# Patient Record
Sex: Male | Born: 1945 | Race: Black or African American | Hispanic: No | Marital: Married
Health system: Midwestern US, Academic
[De-identification: ages and names within clinical notes are randomized; demographics above are authoritative.]

---

## 2021-04-01 ENCOUNTER — Encounter: Admit: 2021-04-01 | Discharge: 2021-04-01 | Payer: MEDICARE

## 2021-04-01 DIAGNOSIS — R0600 Dyspnea, unspecified: Secondary | ICD-10-CM

## 2021-04-01 DIAGNOSIS — G479 Sleep disorder, unspecified: Secondary | ICD-10-CM

## 2021-04-01 DIAGNOSIS — R519 Generalized headaches: Secondary | ICD-10-CM

## 2021-04-01 DIAGNOSIS — I10 Essential (primary) hypertension: Secondary | ICD-10-CM

## 2021-04-01 DIAGNOSIS — F0390 Unspecified dementia without behavioral disturbance: Secondary | ICD-10-CM

## 2021-04-01 DIAGNOSIS — M109 Gout, unspecified: Secondary | ICD-10-CM

## 2021-12-15 ENCOUNTER — Encounter: Admit: 2021-12-15 | Discharge: 2021-12-15 | Payer: MEDICARE

## 2021-12-15 NOTE — Telephone Encounter
12/15/21 - Called and spoke to patient's wife / the patient does not have any outside cardiac records / records are being requested from his PCP - Dr. Dorris Carnes - [facility] NP / (F) [phone] ([phone] / sjg

## 2021-12-16 ENCOUNTER — Encounter: Admit: 2021-12-16 | Discharge: 2021-12-16 | Payer: MEDICARE

## 2021-12-16 DIAGNOSIS — G479 Sleep disorder, unspecified: Secondary | ICD-10-CM

## 2021-12-16 DIAGNOSIS — M109 Gout, unspecified: Secondary | ICD-10-CM

## 2021-12-16 DIAGNOSIS — R0600 Dyspnea, unspecified: Secondary | ICD-10-CM

## 2021-12-16 DIAGNOSIS — F0390 Unspecified dementia without behavioral disturbance: Secondary | ICD-10-CM

## 2021-12-16 DIAGNOSIS — I10 Essential (primary) hypertension: Secondary | ICD-10-CM

## 2021-12-16 DIAGNOSIS — R519 Generalized headaches: Secondary | ICD-10-CM

## 2021-12-16 NOTE — Progress Notes
Records Request    Medical records request for continuation of care:    Patient has appointment  with  Dr. Harvel Ricks .    Please fax records to Cardiovascular Medicine [facility] [phone]    Request records:      Randall Mann  04-Oct-1946    Office Notes (neurology- about 6-7 years ago)    Cardiac Testing    Any cardiac-related records (any since 2017)        Thank you,      Cardiovascular Medicine  [facility]  [address]  [place], [place] [address]  Phone:  [phone]  Fax:  [phone]

## 2021-12-19 ENCOUNTER — Encounter: Admit: 2021-12-19 | Discharge: 2021-12-19 | Payer: MEDICARE

## 2021-12-19 DIAGNOSIS — M109 Gout, unspecified: Secondary | ICD-10-CM

## 2021-12-19 DIAGNOSIS — R0600 Dyspnea, unspecified: Secondary | ICD-10-CM

## 2021-12-19 DIAGNOSIS — R519 Generalized headaches: Secondary | ICD-10-CM

## 2021-12-19 DIAGNOSIS — F0390 Unspecified dementia without behavioral disturbance: Secondary | ICD-10-CM

## 2021-12-19 DIAGNOSIS — I10 Essential (primary) hypertension: Secondary | ICD-10-CM

## 2021-12-19 DIAGNOSIS — G479 Sleep disorder, unspecified: Secondary | ICD-10-CM

## 2021-12-20 ENCOUNTER — Ambulatory Visit: Admit: 2021-12-20 | Discharge: 2021-12-21 | Payer: MEDICARE

## 2021-12-20 ENCOUNTER — Encounter: Admit: 2021-12-20 | Discharge: 2021-12-20 | Payer: MEDICARE

## 2021-12-20 DIAGNOSIS — I10 Essential (primary) hypertension: Secondary | ICD-10-CM

## 2021-12-20 DIAGNOSIS — F0390 Unspecified dementia without behavioral disturbance: Secondary | ICD-10-CM

## 2021-12-20 DIAGNOSIS — R0600 Dyspnea, unspecified: Secondary | ICD-10-CM

## 2021-12-20 DIAGNOSIS — R519 Generalized headaches: Secondary | ICD-10-CM

## 2021-12-20 DIAGNOSIS — M109 Gout, unspecified: Secondary | ICD-10-CM

## 2021-12-20 DIAGNOSIS — R0789 Other chest pain: Secondary | ICD-10-CM

## 2021-12-20 DIAGNOSIS — G479 Sleep disorder, unspecified: Secondary | ICD-10-CM

## 2021-12-20 DIAGNOSIS — F17210 Nicotine dependence, cigarettes, uncomplicated: Secondary | ICD-10-CM

## 2021-12-20 MED ORDER — ROSUVASTATIN 20 MG PO TAB
20 mg | ORAL_TABLET | Freq: Every day | ORAL | 1 refills | 90.00000 days | Status: AC
Start: 2021-12-20 — End: ?

## 2021-12-20 MED ORDER — LOSARTAN-HYDROCHLOROTHIAZIDE 100-25 MG PO TAB
1 | ORAL_TABLET | Freq: Every morning | ORAL | 1 refills | 28.00000 days | Status: AC
Start: 2021-12-20 — End: ?

## 2021-12-20 MED ORDER — BUPROPION HCL (SMOKING DETER) 150 MG PO TB12
150 mg | ORAL_TABLET | Freq: Two times a day (BID) | ORAL | 1 refills | Status: AC
Start: 2021-12-20 — End: ?

## 2021-12-20 NOTE — Progress Notes
Date of Service: 12/20/2021    Randall Mann is a 76 y.o. male.       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  Did have a history of approximately 7 years was able to quit smoking but resumed smoking a few years ago and is continued his current rate.  Patient's had issues with arthritis and had a fall about a year ago with work where he has had residual issues with his fifth finger on the right hand and a lot of discomfort through his elbows and upper torso.  Reports recently had some sharp pains through his left chest on the side lower rib cage.  Was bad enough that he could not complete getting ready for work and was in the emergency room.  Pain alleviated and not recur while he was there.  ECG, cardiac markers and other blood work, chest x-ray did not reveal significant abnormality.  His blood pressure has been elevated typically 150s over 80s when he is on his medications and even higher when he is off his medications.  We will get some pressure discomfort in his head when his blood pressure significantly increased.  Is never had a stroke or strokelike symptoms otherwise.  Remote history of falls and syncope where he had an echocardiogram April 2022 that did not reveal significant structural functional concerns.  Was treated for bronchitis in December with a steroid pack which she recovered from relatively well.  Reports breathing has been stable without significant wheeze or cough.  Reports he has been prone to cottony sharp pains but nothing is as severe as he had have not he was in the emergency department.  Reports that his blood pressures generally been a lot higher and at times he is not always taking his medications.  Previous been told to separate some of his medications and not taking all at once which he also struggles with.  Has been on aspirin because there was concerned about carotid stenosis.  I do not have the official reports but in discussion with the wife it sounds like he is been told the stenosis is less than 50% and he is never had a neurological event.  Does have episodes at night where he has to wake up to drink water be more upright transiently because of discomfort he gets it is more central chest to epigastric region.         Vitals:    12/20/21 0856   BP: (!) 152/80   BP Source: Arm, Right Upper   Pulse: 64   SpO2: 100%   O2 Device: None (Room air)   PainSc: Zero   Weight: 93 kg (205 lb)   Height: 182.9 cm (6')     Body mass index is 27.8 kg/m?Marland Kitchen     Past Medical History  Patient Active Problem List    Diagnosis Date Noted   ? 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 Moderate episode of recurrent major depressive disorder (HCC) 12/16/2021   ? Chest pain 02/01/2012   ? Hypercholesteremia 02/01/2012   ? Hypertension 02/01/2012   ? Tobacco abuse, in remission 02/01/2012   ? Family history of coronary artery disease 02/01/2012   ? PTSD (post-traumatic stress disorder) 02/01/2012     Unknown trigger.            Review  of Systems   Constitutional: Positive for diaphoresis.   HENT: Negative.    Eyes: Negative.    Cardiovascular: Positive for chest pain.   Respiratory: Negative.    Endocrine: Negative.    Hematologic/Lymphatic: Negative.    Skin: Negative.    Musculoskeletal: Positive for muscle cramps.   Gastrointestinal: Negative.    Genitourinary: Negative.    Neurological: Negative.    Psychiatric/Behavioral: Negative.    Allergic/Immunologic: Negative.        Physical Exam  Patient is awake alert, protuberant abdomen but otherwise normal build.  Very stiff in his motion and flat affect  He is accompanied by his wife  Pupils are equal round without scleral injection  Neck is supple with normal carotid stroke, I listened twice and was not able to identify bruits.  No jugular venous abnormalities  Lungs show overall diminished breath sounds with marginal effort and no focal crackles or wheezes  Heart S1, S2 that are normal.  No murmurs, clicks, or gallops  Abdomen is protuberant but soft and nontender  Pulses are 2+, regular, and symmetric bilaterally radial locations more 1+ but symmetric bilaterally at the pedal location  No significant peripheral edema with symmetric muscle tone and skin turgor  Cardiovascular Studies    Reviewed imaging results and lab from his January 27 emergency department visit.  Looked at the ECG personally  Cardiovascular Health Factors  Vitals BP Readings from Last 3 Encounters:   12/20/21 (!) 152/80   08/17/14 149/81   02/01/12 121/87     Wt Readings from Last 3 Encounters:   12/20/21 93 kg (205 lb)   08/17/14 108.9 kg (240 lb)   04/29/12 108.4 kg (239 lb)     BMI Readings from Last 3 Encounters:   12/20/21 27.80 kg/m?   08/17/14 31.66 kg/m?   04/29/12 31.53 kg/m?      Smoking Social History     Tobacco Use   Smoking Status Every Day   ? Packs/day: 1.00   ? Years: 35.00   ? Pack years: 35.00   ? Types: Cigarettes   Smokeless Tobacco Never   Tobacco Comments    started smoking agian 9/28      Lipid Profile Cholesterol   Date Value Ref Range Status   12/03/2019 145  Final     HDL   Date Value Ref Range Status   12/03/2019 36 (L) >40 Final     LDL   Date Value Ref Range Status   12/03/2019 95  Final     Triglycerides   Date Value Ref Range Status   12/03/2019 71  Final      Blood Sugar No results found for: HGBA1C  Glucose   Date Value Ref Range Status   12/16/2021 106 (H) 70 - 105 Final   05/13/2021 115 (H) 70 - 105 Final   08/17/2014 88 65 - 99 mg/dL Final     Comment:                   Fasting reference interval               Problems Addressed Today  Encounter Diagnoses   Name Primary?   ? Other chest pain Yes   ? Tobacco dependence due to cigarettes    ? Hypertension, unspecified type        Assessment and Plan     Chest pain would be inconsistent with cardiac etiology.  Findings most likely more chest wall driven from  his chronic COPD.  It is examination evaluation I do have concerns for a Parkinson-like disorder that may be part of his functional issues and frequent hospital visits.  I also have findings for COPD with no acute exacerbation findings.  His risk for cardiovascular disease include smoking, age, gender, hypertension that is poorly controlled.  We will optimize his risk factor modification.  Will change his losartan to combination losartan/hydrochlorothiazide at 100/25 mg p.o. daily.  We will continue on the amlodipine at 5 mg p.o. daily.  We will start rosuvastatin at 20 mg p.o. nightly.  I am going to have him stop the aspirin.  Based upon what the wife tells me and my exam I do not identify significant carotid stenosis.  He does have symptoms of central chest discomfort especially in the evening that is relieved with food or drink most consistent with esophagitis.  He also struggles with compliance and consistency with taking his medications.  Therefore we will take off the medications at once and try to simplify that is much as possible.  Strongly encourage the patient for smoking cessation.  After discussion we will go ahead and start Zyban 150 mg p.o. twice daily.  Set goal of smoking cessation approximately 2 weeks.  We will arrange for nuclear cardiac stress test because of his risk factors and recurrent symptoms.  We will contact him about the results and determine further follow-up at that time.         Current Medications (including today's revisions)  ? amLODIPine (NORVASC) 10 mg tablet Take 10 mg by mouth daily.   ? aspirin 325 mg tablet Take 325 mg by mouth daily. Take with food.   ? buPROPion HCL (smoking deter) (ZYBAN) 150 mg tablet Take one tablet by mouth twice daily. Indications: anxiousness associated with depression, stop smoking   ? clonazePAM (KLONOPIN) 1 mg tablet Take 1.5 mg by mouth at bedtime daily.     ? cyanocobalamin (vitamin B-12) 100 mcg tablet Take 100 mcg by mouth daily.   ? folic acid/multivit-min/lutein (CENTRUM SILVER PO) Take  by mouth.   ? losartan-hydroCHLOROthiazide (HYZAAR) 100-25 mg tablet Take one tablet by mouth every morning.   ? rOPINIRole (REQUIP) 0.5 mg tablet Take 0.5 mg by mouth three times daily.   ? rosuvastatin (CRESTOR) 20 mg tablet Take one tablet by mouth daily.   ? tadalafiL (CIALIS) 20 mg tablet Take 20 mg by mouth as Needed for Erectile dysfunction.

## 2021-12-20 NOTE — Progress Notes
Records Request-STAT    Medical records request for continuation of care:    Patient has appointment NOW   with  Dr. Harvel Ricks .    Please fax records to Cardiovascular [facility] [place] [facility] [phone]    Request records:    Randall Mann  Dec 14, 1945    CAROTID TESTING in the last 10 years      Thank you,      Cardiovascular Medicine  [facility]  [address]  [place], [place] [address]  Phone:  [phone]  Fax:  [phone]

## 2022-01-03 ENCOUNTER — Encounter: Admit: 2022-01-03 | Discharge: 2022-01-03 | Payer: MEDICARE

## 2022-01-03 NOTE — Telephone Encounter
Received: Today  Hoos-Thompson, Majel Homer, MD  Rogelia Boga, RN  Caller: Unspecified (Today, 10:37 AM)  Have him stop the losartan/hctz, stay on rosuvastatin at this time.     Called and discussed with patient.  He is agreeable to plan.  Med list updated.  They will continue to monitor his BP and call us back to let us know how he is feeling and how BP is running.

## 2022-01-03 NOTE — Telephone Encounter
Received a call from [name]'s wife stating that Randall Mann is not tolerating the new medications prescribed by Dr. Harvel Ricks.  She states that he is not taking the bupropion for tobacco cessastion because he is not yet ready to quit smoking.  He is taking the lisinopril/HCTZ and rosuvastatin.  His wife states that he feels very nauseous, weak and tired since taking the medication and he has missed a week of work just not feeling well.  She said he takes the medication at the same time.  I recommended that he take the BP med in the morning and the rosuvastatin at night.  His wife says that he works nights 11pm-8am and that will not work for him.  She denies any muscle aches/cramps or pains, no cough.  His blood pressure is running around 140/78, they do not monitor his heart rate.  He is scheduled for a stress test 3/3.  They are hoping his medication can be changed because he does not want to continue to miss work.    Will route to Dr. Harvel Ricks for recommendations.

## 2022-01-20 ENCOUNTER — Encounter: Admit: 2022-01-20 | Discharge: 2022-01-20 | Payer: MEDICARE

## 2022-01-20 ENCOUNTER — Ambulatory Visit: Admit: 2022-01-20 | Discharge: 2022-01-20 | Payer: MEDICARE

## 2022-01-20 DIAGNOSIS — R0789 Other chest pain: Secondary | ICD-10-CM

## 2022-01-20 DIAGNOSIS — I10 Essential (primary) hypertension: Secondary | ICD-10-CM

## 2022-01-20 MED ORDER — NITROGLYCERIN 0.4 MG SL SUBL
.4 mg | SUBLINGUAL | 0 refills | Status: AC | PRN
Start: 2022-01-20 — End: ?

## 2022-01-20 MED ORDER — SODIUM CHLORIDE 0.9 % IV SOLP
250 mL | INTRAVENOUS | 0 refills | Status: AC | PRN
Start: 2022-01-20 — End: ?

## 2022-01-20 MED ORDER — AMINOPHYLLINE 25 MG/ML IV SOLN
50 mg | INTRAVENOUS | 0 refills | Status: AC | PRN
Start: 2022-01-20 — End: ?

## 2022-01-20 MED ORDER — REGADENOSON 0.4 MG/5 ML IV SYRG
.4 mg | Freq: Once | INTRAVENOUS | 0 refills | Status: CP
Start: 2022-01-20 — End: ?

## 2022-01-20 MED ORDER — ALBUTEROL SULFATE 90 MCG/ACTUATION IN HFAA
2 | RESPIRATORY_TRACT | 0 refills | Status: AC | PRN
Start: 2022-01-20 — End: ?

## 2022-01-20 MED ORDER — RP DX TC-99M TETROFOSMIN MCI
9 | Freq: Once | INTRAVENOUS | 0 refills | Status: CP
Start: 2022-01-20 — End: ?

## 2022-01-20 MED ORDER — EUCALYPTUS-MENTHOL MM LOZG
1 | Freq: Once | ORAL | 0 refills | Status: AC | PRN
Start: 2022-01-20 — End: ?

## 2022-01-20 MED ORDER — RP DX TC-99M TETROFOSMIN MCI
26 | Freq: Once | INTRAVENOUS | 0 refills | Status: CP
Start: 2022-01-20 — End: ?

## 2022-01-23 ENCOUNTER — Encounter: Admit: 2022-01-23 | Discharge: 2022-01-23 | Payer: MEDICARE

## 2022-01-23 NOTE — Telephone Encounter
Discussed stress test results with patient's wife. She will share results with patient. She does not have any further questions or concerns at this time.

## 2022-08-29 IMAGING — US VDUPLELT
1 series · 14 of 16 positions shown · non-contrast
Comparison: No relevant prior studies available.

EXAM:  US DUPLEX LEFT LOWER EXTREMITY VEINS  (79729)
INDICATION: ankle edema rule out DVT left anke edema; r/o dvt
TECHNIQUE: Real-time duplex ultrasound scan of the left lower extremity veins integrating B-mode
two-dimensional vascular structure, Doppler spectral analysis, color flow Doppler imaging and
compression.

[Series 1: us venous duplex low ext left · portal-venous · 14 of 31 slices shown]
[im 1/31]
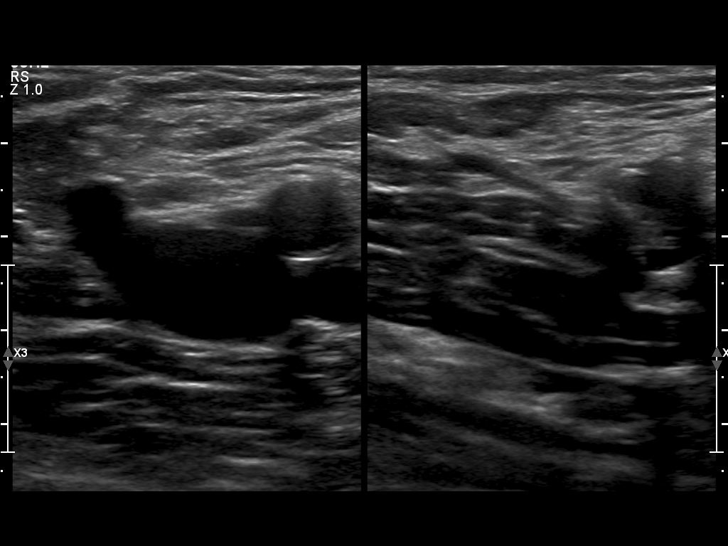
[im 3/31]
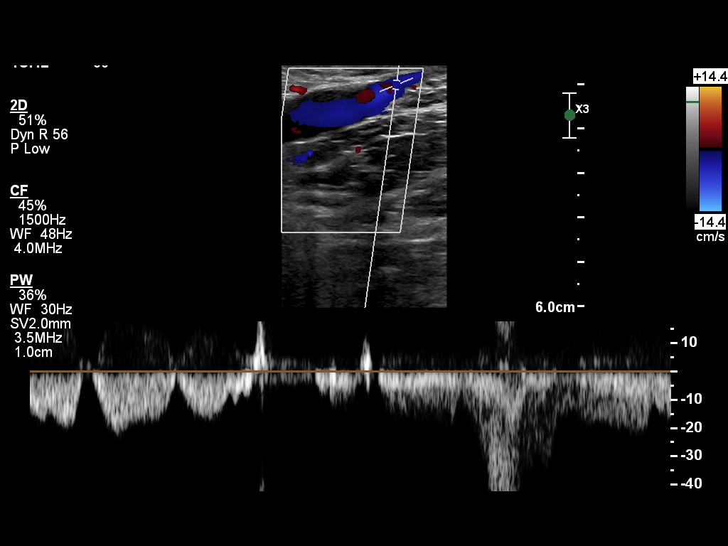
[im 5/31]
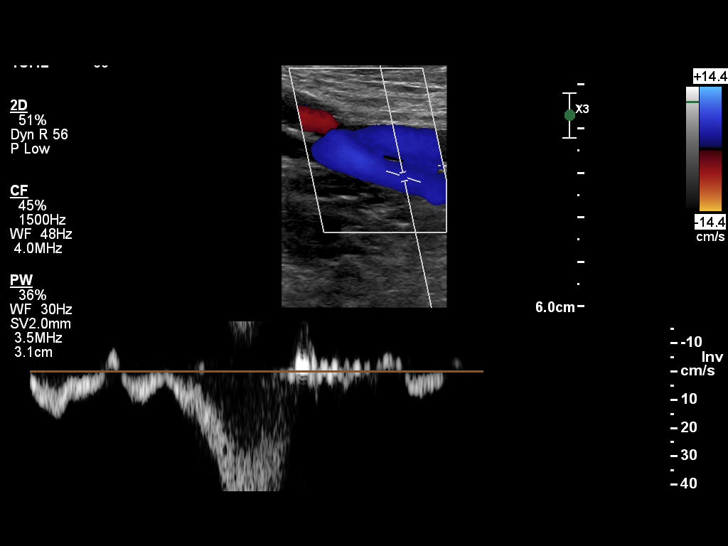
[im 9/31]
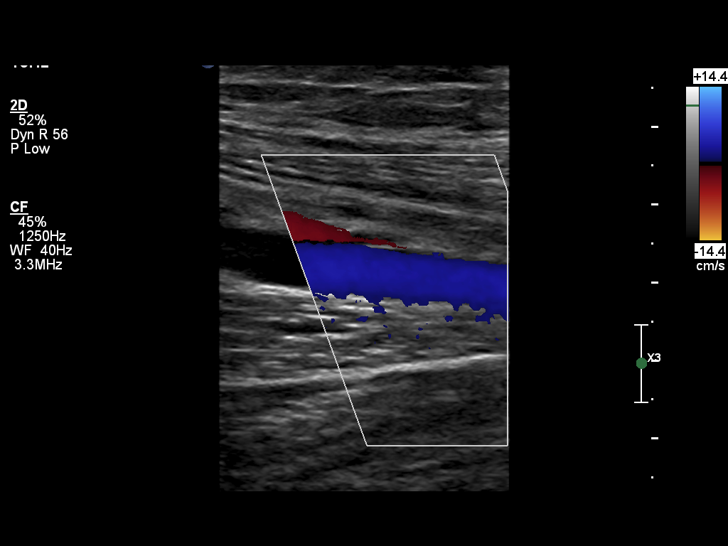
[im 11/31]
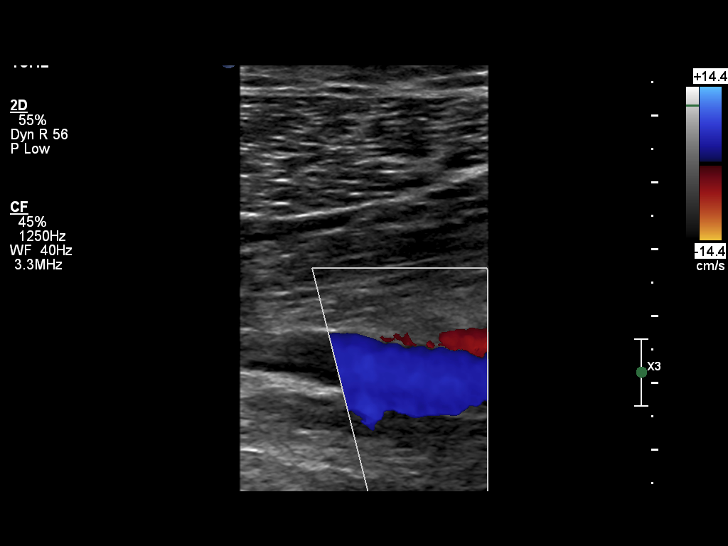
[im 13/31]
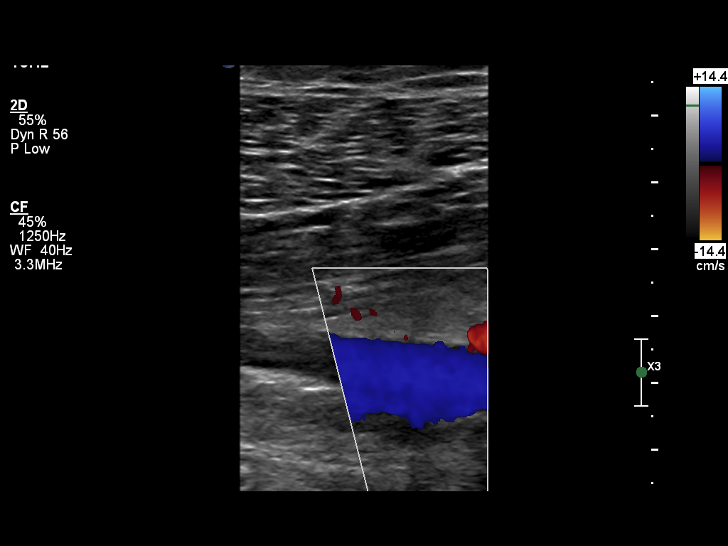
[im 15/31]
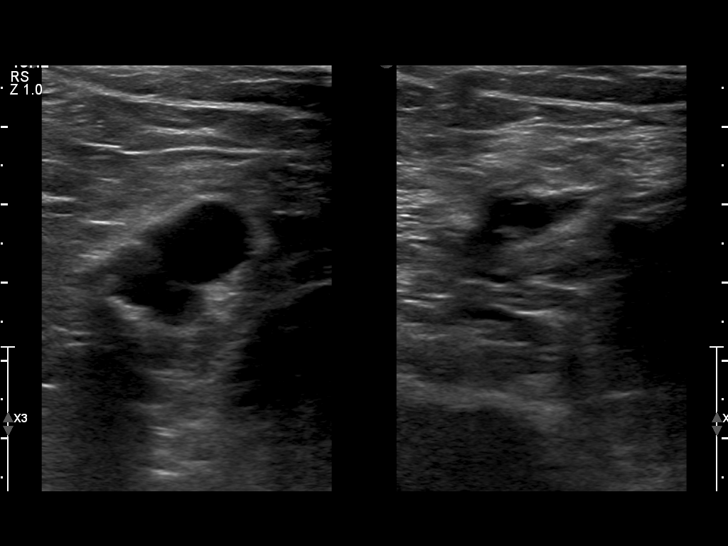
[im 17/31]
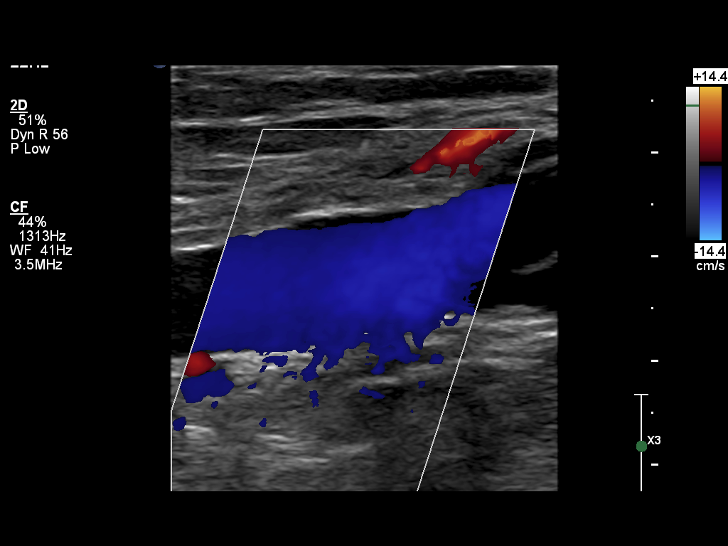
[im 19/31]
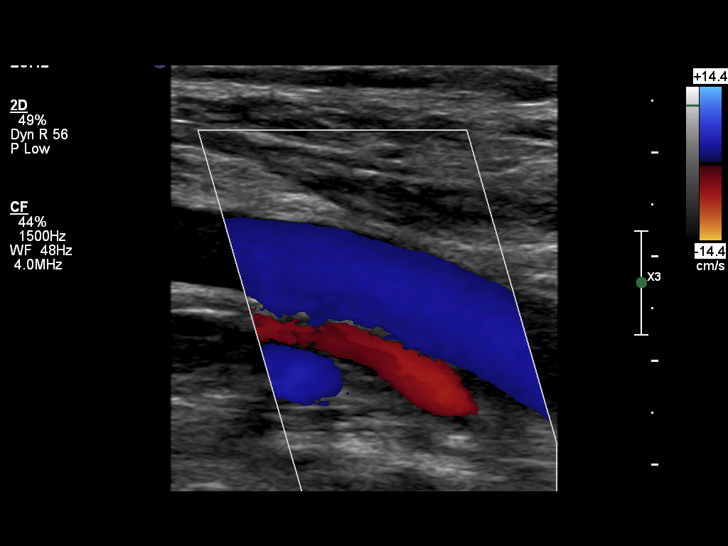
[im 21/31]
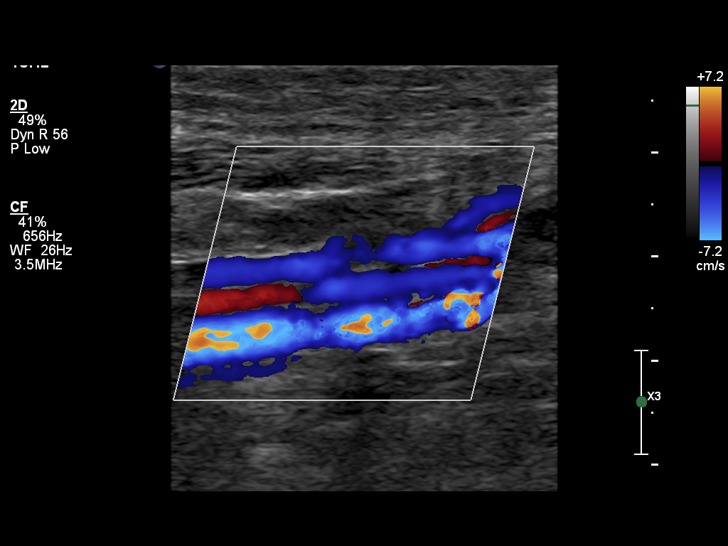
[im 25/31]
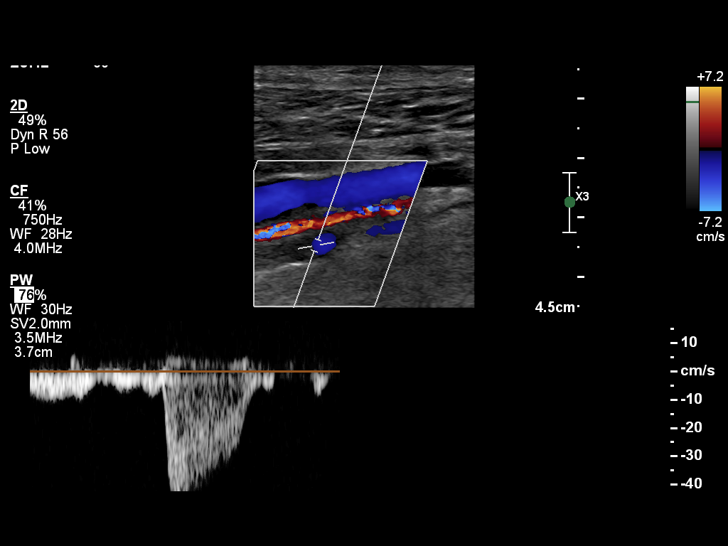
[im 27/31]
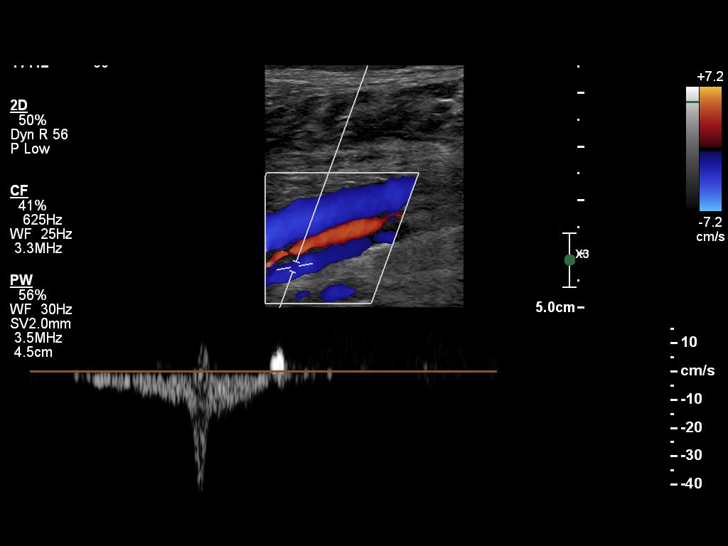
[im 29/31]
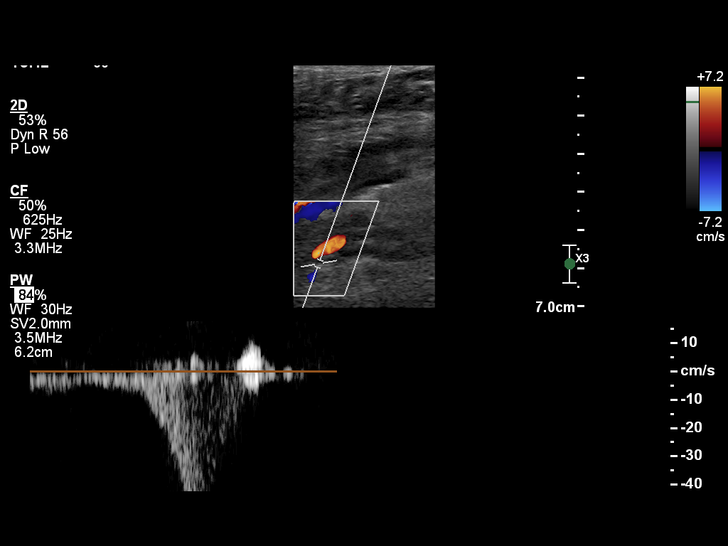
[im 31/31]
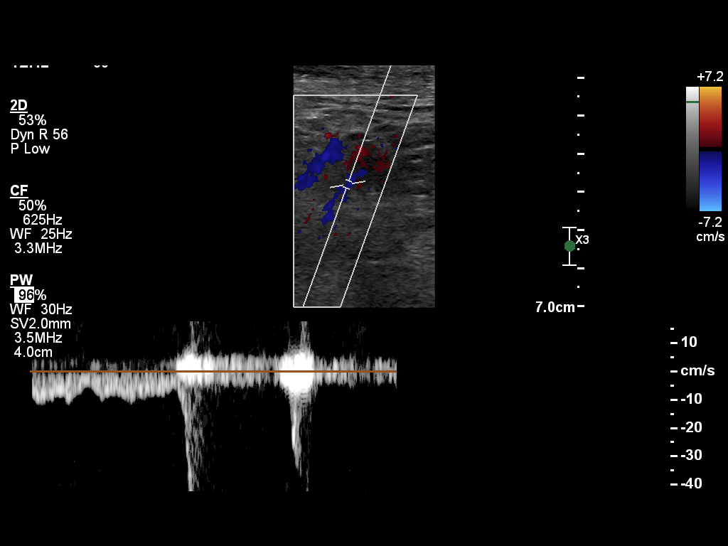

[14 of 16 positions shown; findings below may reference images not displayed]

FINDINGS: DEEP VEINS:  There is NO evidence of acute deep vein thrombosis from the common femoral to the
visualized calf veins.

SUPERFICIAL VEINS:  There is NO evidence of thrombosis in the visualized superficial veins.

SOFT TISSUES:  NO focal fluid collections.
IMPRESSION: - NO evidence of deep venous thrombosis in the lower extremity veins as visualized.

Tech Notes:

left anke edema; r/o dvt

## 2022-09-21 ENCOUNTER — Encounter: Admit: 2022-09-21 | Discharge: 2022-09-21 | Payer: Commercial Managed Care - HMO

## 2022-09-21 MED ORDER — ROSUVASTATIN 20 MG PO TAB
20 mg | ORAL_TABLET | Freq: Every day | ORAL | 0 refills | 90.00000 days | Status: AC
Start: 2022-09-21 — End: ?

## 2022-11-27 ENCOUNTER — Encounter: Admit: 2022-11-27 | Discharge: 2022-11-27 | Payer: Commercial Managed Care - HMO

## 2022-11-27 MED ORDER — ROSUVASTATIN 20 MG PO TAB
20 mg | ORAL_TABLET | Freq: Every day | ORAL | 0 refills | 90.00000 days | Status: AC
Start: 2022-11-27 — End: ?

## 2023-04-03 IMAGING — CR [ID]
1 series · 1 of 1 positions shown · non-contrast
Comparison: 05/13/2021.
COMPARISON: 05/13/2021.

DIAGNOSTIC STUDIES

EXAM:  XR CHEST, 1 VIEW  (64134)
INDICATION: chest pain Chest pain. Patient states that he is a current smoker. TB
TECHNIQUE: Single view of the chest.

[x chest ap]
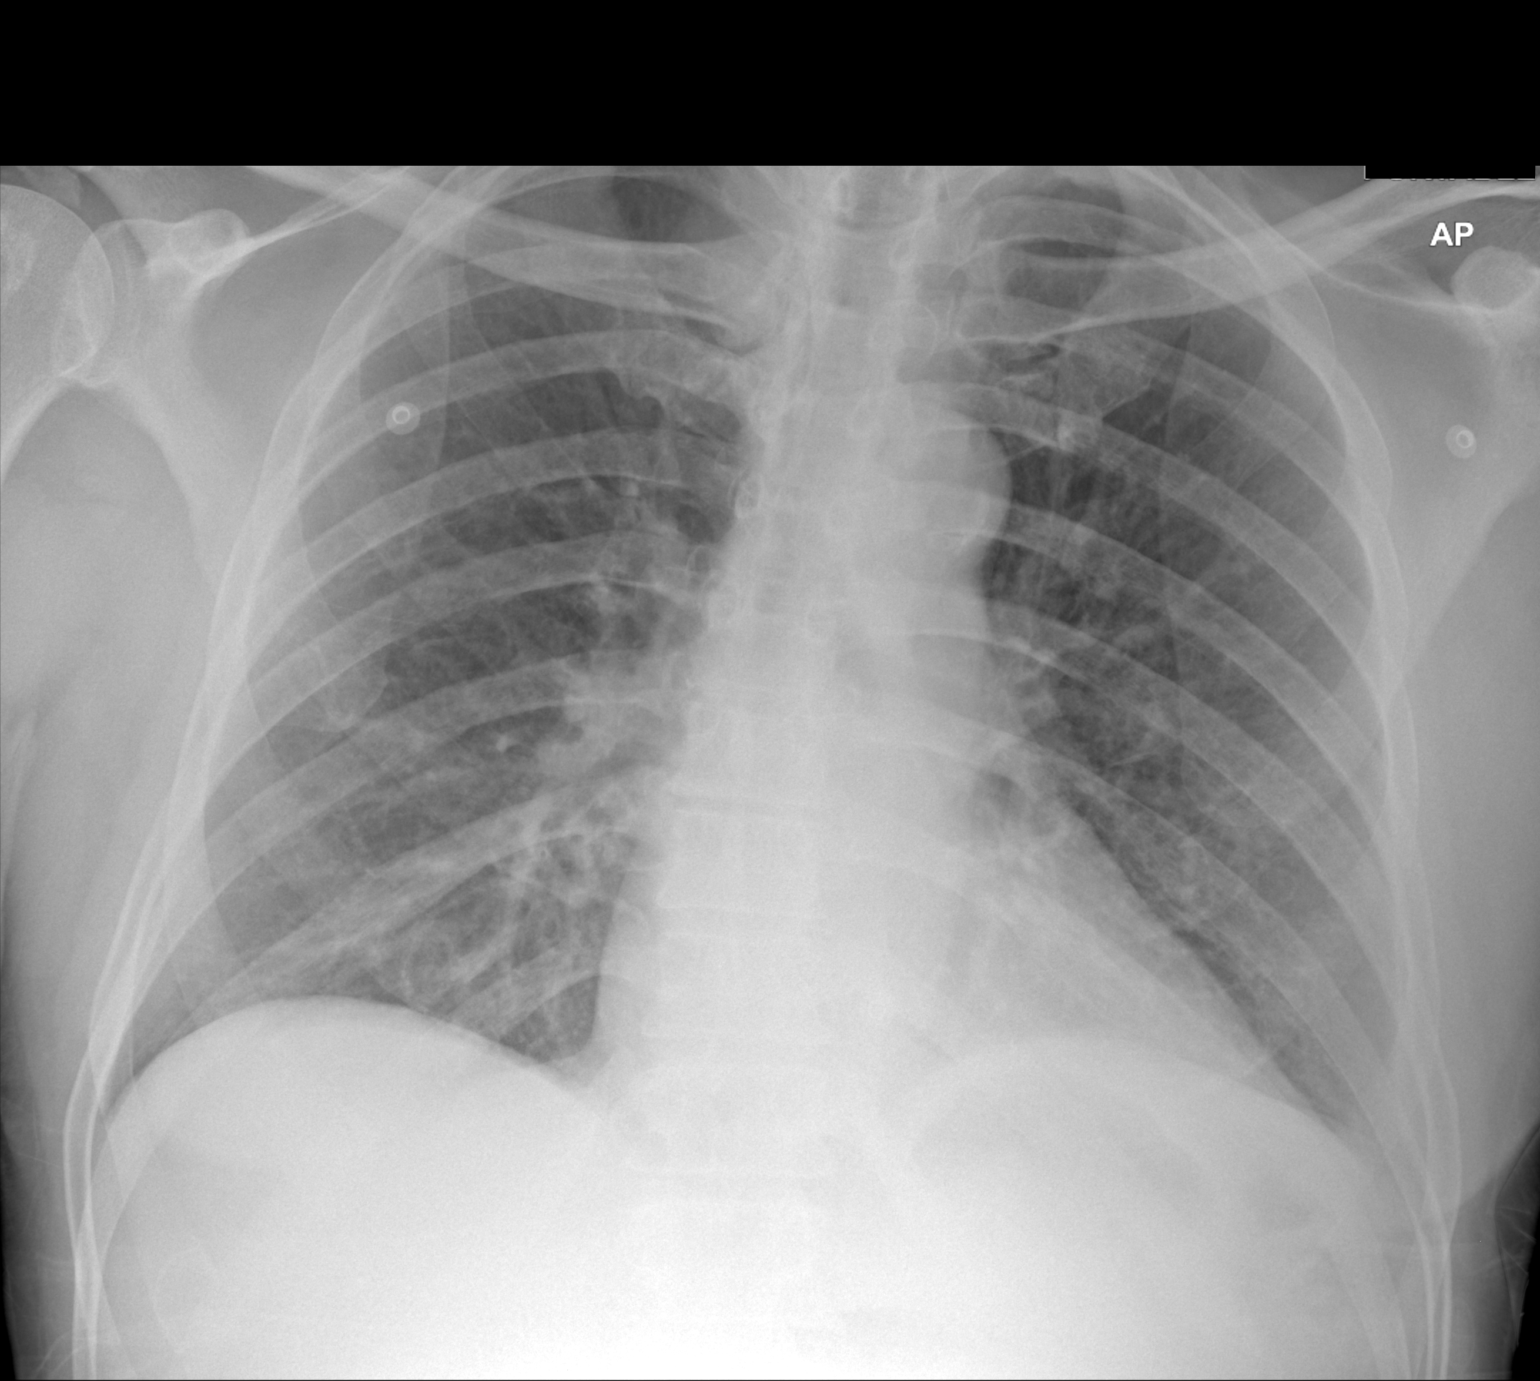

[1 of 1 positions shown; findings below may reference images not displayed]

FINDINGS: LUNGS:  The lungs demonstrate NO consolidations or significant effusions.

HEART:  The heart borders are mostly obscured, apex NOT visualized.  NO change from prior study.

BONES/JOINTS:  No definite acute changes.

VASCULATURE:  Mild arterial calcification.
IMPRESSION: NO consolidative pneumonia, significant atelectasis or effusion.

Additional chronic/incidental findings as described.

DIAGNOSTIC STUDIES

EXAM:  XR CHEST, 1 VIEW  (64134)
FINDINGS: LUNGS:  The lungs demonstrate NO consolidations or significant effusions.

HEART:  The heart borders are mostly obscured, apex NOT visualized.  NO change from prior study.

BONES/JOINTS:  No definite acute changes.

VASCULATURE:  Mild arterial calcification.
IMPRESSION: NO consolidative pneumonia, significant atelectasis or effusion.

Additional chronic/incidental findings as described.

Tech Notes:

Chest pain. Patient states that he is a current smoker. TB

## 2023-04-13 ENCOUNTER — Encounter: Admit: 2023-04-13 | Discharge: 2023-04-13 | Payer: Commercial Managed Care - HMO

## 2023-04-13 DIAGNOSIS — E7800 Pure hypercholesterolemia, unspecified: Secondary | ICD-10-CM

## 2023-04-13 DIAGNOSIS — Z8249 Family history of ischemic heart disease and other diseases of the circulatory system: Secondary | ICD-10-CM

## 2023-04-13 DIAGNOSIS — I10 Essential (primary) hypertension: Secondary | ICD-10-CM

## 2023-04-13 MED ORDER — ROSUVASTATIN 20 MG PO TAB
20 mg | ORAL_TABLET | Freq: Every day | ORAL | 0 refills | 90.00000 days | Status: AC
Start: 2023-04-13 — End: ?

## 2023-04-27 ENCOUNTER — Encounter: Admit: 2023-04-27 | Discharge: 2023-04-27 | Payer: Commercial Managed Care - HMO

## 2023-04-27 DIAGNOSIS — I10 Essential (primary) hypertension: Secondary | ICD-10-CM

## 2023-04-27 DIAGNOSIS — E7800 Pure hypercholesterolemia, unspecified: Secondary | ICD-10-CM

## 2023-04-27 DIAGNOSIS — Z8249 Family history of ischemic heart disease and other diseases of the circulatory system: Secondary | ICD-10-CM

## 2023-04-27 LAB — LIPID PROFILE
CHOLESTEROL/HDL %: 3
CHOLESTEROL: 131
HDL: 50
LDL: 61
TRIGLYCERIDES: 104
VLDL: 21

## 2023-07-11 ENCOUNTER — Encounter: Admit: 2023-07-11 | Discharge: 2023-07-11 | Payer: MEDICARE

## 2023-07-11 MED ORDER — ROSUVASTATIN 20 MG PO TAB
20 mg | ORAL_TABLET | Freq: Every day | ORAL | 0 refills | 90.00000 days | Status: AC
Start: 2023-07-11 — End: ?

## 2023-07-30 ENCOUNTER — Ambulatory Visit: Admit: 2023-07-30 | Discharge: 2023-07-30 | Payer: MEDICARE

## 2023-07-30 ENCOUNTER — Encounter: Admit: 2023-07-30 | Discharge: 2023-07-30 | Payer: MEDICARE

## 2023-07-30 DIAGNOSIS — G479 Sleep disorder, unspecified: Secondary | ICD-10-CM

## 2023-07-30 DIAGNOSIS — I10 Essential (primary) hypertension: Secondary | ICD-10-CM

## 2023-07-30 DIAGNOSIS — F17210 Nicotine dependence, cigarettes, uncomplicated: Secondary | ICD-10-CM

## 2023-07-30 DIAGNOSIS — J449 Chronic obstructive pulmonary disease, unspecified: Secondary | ICD-10-CM

## 2023-07-30 DIAGNOSIS — R0600 Dyspnea, unspecified: Secondary | ICD-10-CM

## 2023-07-30 DIAGNOSIS — R519 Generalized headaches: Secondary | ICD-10-CM

## 2023-07-30 DIAGNOSIS — G629 Polyneuropathy, unspecified: Secondary | ICD-10-CM

## 2023-07-30 DIAGNOSIS — F0390 Unspecified dementia without behavioral disturbance: Secondary | ICD-10-CM

## 2023-07-30 DIAGNOSIS — M109 Gout, unspecified: Secondary | ICD-10-CM

## 2023-07-30 MED ORDER — ASPIRIN 81 MG PO TBEC
81 mg | ORAL_TABLET | Freq: Every day | ORAL | 1 refills | Status: AC
Start: 2023-07-30 — End: ?

## 2023-07-30 MED ORDER — BUPROPION SR 150 MG PO TBSR
150 mg | ORAL_TABLET | Freq: Two times a day (BID) | ORAL | 3 refills | Status: AC
Start: 2023-07-30 — End: ?

## 2023-07-30 MED ORDER — LOSARTAN-HYDROCHLOROTHIAZIDE 100-25 MG PO TAB
1 | ORAL_TABLET | Freq: Every morning | ORAL | 3 refills | 28.00000 days | Status: AC
Start: 2023-07-30 — End: ?

## 2023-07-30 MED ORDER — GABAPENTIN 100 MG PO CAP
100 mg | ORAL_CAPSULE | Freq: Two times a day (BID) | ORAL | 3 refills | Status: AC
Start: 2023-07-30 — End: ?

## 2023-07-30 NOTE — Progress Notes
Date of Service: 07/30/2023    Randall Mann is a 77 y.o. male.       HPI     Patient is a 77 year old male past medical history of COPD with ongoing tobacco use, did have a history of quitting smoking for several years but now is continued for several years, hypertension with hypertensive heart disease, currently having symptoms in the balls of his feet to his toes where it feels fold is swollen and uncomfortable, denies claudication or rest pain type symptoms.  No history of stroke with previous evaluation has showed significant disease within his carotid arteries.  Is not currently having chest pain, palpitations, or lightheadedness.  Has had issues with persistently elevated high blood pressure and came feel poorly when her blood pressure is high.  In review of my last records goal was to start him on Wellbutrin as well as a combination losartan hydrochlorothiazide for blood pressure and smoking cessation.  For some reason those changes did not get made.  He did undergo a stress test that did not reveal significant ischemia and otherwise preserved left ventricular ejection fraction.         Vitals:    07/30/23 1143   BP: 138/74   BP Source: Arm, Left Upper   Pulse: 60   SpO2: 98%   O2 Device: None (Room air)   PainSc: Zero   Weight: 95.3 kg (210 lb)   Height: 185.4 cm (6' 1)     Body mass index is 27.71 kg/m?Marland Kitchen     Past Medical History  Patient Active Problem List    Diagnosis Date Noted    Insomnia 12/16/2021    Moderate episode of recurrent major depressive disorder (HCC) 12/16/2021    Chest pain 02/01/2012    Hypercholesteremia 02/01/2012    Hypertension 02/01/2012    Tobacco abuse, in remission 02/01/2012    Family history of coronary artery disease 02/01/2012    PTSD (post-traumatic stress disorder) 02/01/2012     Unknown trigger.            Review of Systems   Constitutional: Negative.   HENT: Negative.     Eyes: Negative.    Cardiovascular: Negative.    Respiratory: Negative.     Endocrine: Negative. Hematologic/Lymphatic: Negative.    Skin: Negative.    Musculoskeletal: Negative.    Gastrointestinal: Negative.    Genitourinary: Negative.    Neurological: Negative.    Psychiatric/Behavioral: Negative.     Allergic/Immunologic: Negative.        Physical Exam  Awake alert, orient x 3 in no acute distress  Accompanied by his wife  Pupils are equal reactive without scleral injection  Neck is supple with normal carotid upstroke no bruit, mass, or jugular venous abnormality sign chest is symmetric and lungs are clear to auscultation with no focal crackles or wheezes  Heart S1, S2 normal.  No obvious murmur, click, or gallop  Abdomen is flat nontender  Pulses are 2+, regular, symmetric bilaterally radial as well as pedal location  No peripheral edema with symmetric muscle tone skin turgor  Does have decreased sensation just at the very distal foot and into the toes with no other ulcers or abnormalities identified  Cardiovascular Studies      Cardiovascular Health Factors  Vitals BP Readings from Last 3 Encounters:   07/30/23 138/74   12/20/21 (!) 152/80   08/17/14 149/81     Wt Readings from Last 3 Encounters:   07/30/23 95.3 kg (210 lb)  12/20/21 93 kg (205 lb)   08/17/14 108.9 kg (240 lb)     BMI Readings from Last 3 Encounters:   07/30/23 27.71 kg/m?   12/20/21 27.80 kg/m?   08/17/14 31.66 kg/m?      Smoking Social History     Tobacco Use   Smoking Status Every Day    Current packs/day: 1.00    Average packs/day: 1 pack/day for 35.0 years (35.0 ttl pk-yrs)    Types: Cigarettes   Smokeless Tobacco Never   Tobacco Comments    started smoking agian 9/28      Lipid Profile Cholesterol   Date Value Ref Range Status   04/27/2023 131  Final     HDL   Date Value Ref Range Status   04/27/2023 50  Final     LDL   Date Value Ref Range Status   04/27/2023 61  Final     Triglycerides   Date Value Ref Range Status   04/27/2023 104  Final      Blood Sugar No results found for: HGBA1C  Glucose   Date Value Ref Range Status 12/16/2021 106 (H) 70 - 105 Final   05/13/2021 115 (H) 70 - 105 Final   08/17/2014 88 65 - 99 mg/dL Final     Comment:                   Fasting reference interval               Problems Addressed Today  Encounter Diagnoses   Name Primary?    Hypertension, unspecified type Yes    Tobacco dependence due to cigarettes     Chronic obstructive pulmonary disease, unspecified COPD type (HCC)     Neuropathy        Assessment and Plan     Hypertension which continues to be suboptimally controlled.  Is at a stage II level  -Will maintain him on the amlodipine and add the combination valsartan/hydrochlorothiazide.    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  -Will start him on Wellbutrin at 150 mg p.o. twice daily did set smoking cessation date by October 15.  -Did discuss having a celebratory dinner if he is able to quit his next follow-up    Peripheral vascular disease  -No symptoms or findings for unstable abnormalities.  Known disease identified with previous peripheral vascular study  -Will arrange for carotid artery ultrasound and notify him once results are available.  Maintain on current dose of rosuvastatin and aspirin 81 mg daily         Current Medications (including today's revisions)   amLODIPine (NORVASC) 10 mg tablet Take one tablet by mouth daily.    aspirin EC (ASPIR-LOW) 81 mg tablet Take one tablet by mouth daily.    buPROPion HCL SR (WELLBUTRIN SR) 150 mg tablet, 12 hr sustained-release Take one tablet by mouth twice daily.    clonazePAM (KLONOPIN) 1 mg tablet Take 1.5 tablets by mouth at bedtime daily.      cyanocobalamin (vitamin B-12) 100 mcg tablet Take one tablet by mouth daily.    folic acid/multivit-min/lutein (CENTRUM SILVER PO) Take  by mouth.    gabapentin (NEURONTIN) 100 mg  capsule Take one capsule by mouth twice daily.    losartan-hydroCHLOROthiazide (HYZAAR) 100-25 mg tablet Take one tablet by mouth every morning.    rOPINIRole (REQUIP) 0.5 mg tablet Take one tablet by mouth at bedtime daily.    rosuvastatin (CRESTOR) 20 mg tablet Take 1 tablet by mouth once daily    tadalafiL (CIALIS) 20 mg tablet Take one tablet by mouth as Needed for Erectile dysfunction.

## 2023-09-06 ENCOUNTER — Encounter: Admit: 2023-09-06 | Discharge: 2023-09-06 | Payer: MEDICARE

## 2023-09-06 MED ORDER — ROSUVASTATIN 20 MG PO TAB
20 mg | ORAL_TABLET | Freq: Every day | ORAL | 3 refills | 90.00000 days | Status: AC
Start: 2023-09-06 — End: ?

## 2024-11-15 ENCOUNTER — Encounter: Admit: 2024-11-15 | Discharge: 2024-11-15 | Payer: MEDICARE
